# Patient Record
Sex: Male | Born: 2000 | Race: Black or African American | Hispanic: No | Marital: Single | Smoking: Current every day smoker
Health system: Southern US, Community
[De-identification: ages and names within clinical notes are randomized; demographics above are authoritative.]

## PROBLEM LIST (undated history)

## (undated) DIAGNOSIS — J45909 Unspecified asthma, uncomplicated: Secondary | ICD-10-CM

---
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
## 2020-03-08 DEATH — deceased
